# Patient Record
Sex: Male | Born: 1995 | Race: Black or African American | Hispanic: No | Marital: Single | Smoking: Former smoker
Health system: Southern US, Community
[De-identification: ages and names within clinical notes are randomized; demographics above are authoritative.]

---
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
## 2019-04-05 ENCOUNTER — Ambulatory Visit: Payer: BC Managed Care – PPO | Attending: Internal Medicine

## 2019-04-05 DIAGNOSIS — Z20822 Contact with and (suspected) exposure to covid-19: Secondary | ICD-10-CM | POA: Insufficient documentation

## 2019-04-06 LAB — NOVEL CORONAVIRUS, NAA: SARS-CoV-2, NAA: NOT DETECTED

## 2019-04-08 ENCOUNTER — Ambulatory Visit: Payer: Self-pay

## 2019-04-08 NOTE — Telephone Encounter (Signed)
Unable to LM

## 2019-06-20 ENCOUNTER — Other Ambulatory Visit (HOSPITAL_COMMUNITY): Payer: Self-pay | Admitting: Family Medicine

## 2019-06-20 ENCOUNTER — Other Ambulatory Visit: Payer: Self-pay | Admitting: Family Medicine

## 2019-06-20 DIAGNOSIS — R1032 Left lower quadrant pain: Secondary | ICD-10-CM

## 2019-06-20 DIAGNOSIS — N50812 Left testicular pain: Secondary | ICD-10-CM

## 2019-06-21 ENCOUNTER — Other Ambulatory Visit: Payer: Self-pay

## 2019-06-21 ENCOUNTER — Ambulatory Visit
Admission: RE | Admit: 2019-06-21 | Discharge: 2019-06-21 | Disposition: A | Discharge status: Home / Self Care | Payer: BC Managed Care – PPO | Source: Ambulatory Visit | Attending: Family Medicine | Admitting: Family Medicine

## 2019-06-21 DIAGNOSIS — R1032 Left lower quadrant pain: Secondary | ICD-10-CM | POA: Insufficient documentation

## 2019-06-21 DIAGNOSIS — N50812 Left testicular pain: Secondary | ICD-10-CM | POA: Diagnosis present

## 2020-11-20 IMAGING — US US SCROTUM W/ DOPPLER COMPLETE
1 series · 13 of 25 positions shown · non-contrast
Comparison: None available.

CLINICAL DATA: Initial evaluation for left groin pain, pain in left
testicle for 4 days.

EXAM:
SCROTAL ULTRASOUND
DOPPLER ULTRASOUND OF THE TESTICLES
TECHNIQUE: Complete ultrasound examination of the testicles, epididymis, and
other scrotal structures was performed. Color and spectral Doppler
ultrasound were also utilized to evaluate blood flow to the
testicles.

[Series 1: us scrotum w/ doppler complete · 0.08mm/px · 13 of 55 slices shown]
[im 1/55]
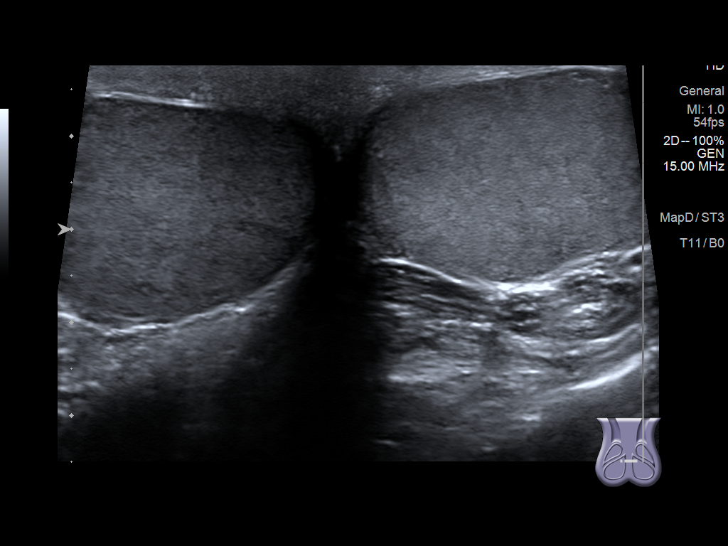
[im 5/55]
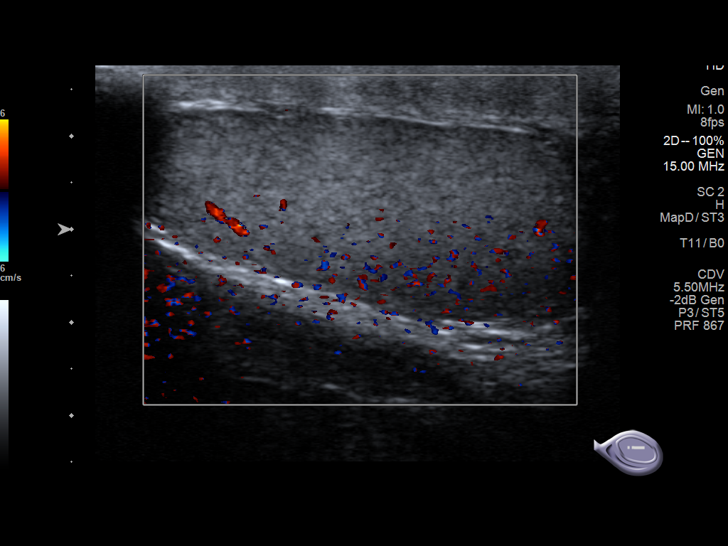
[im 10/55]
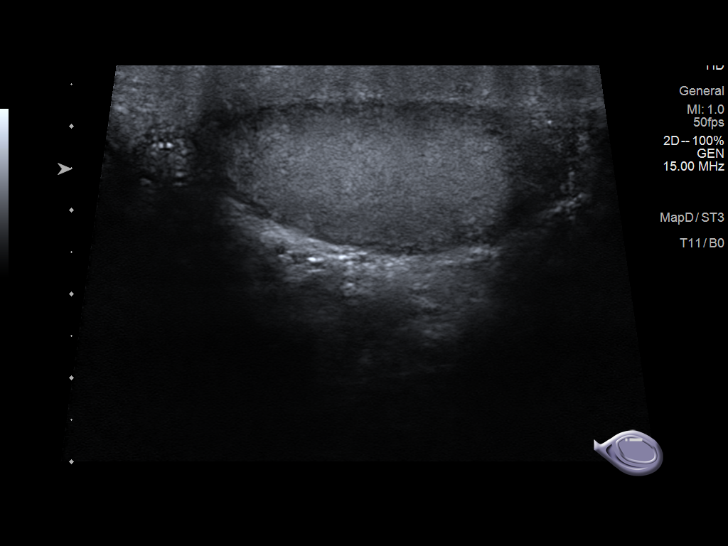
[im 14/55]
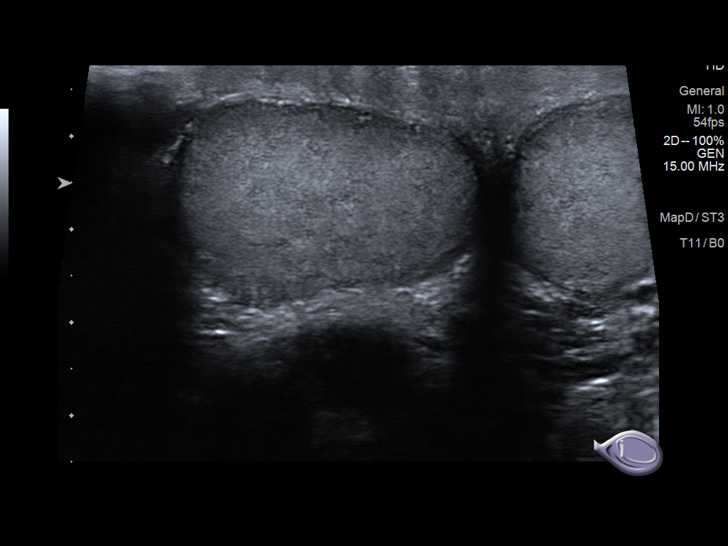
[im 19/55]
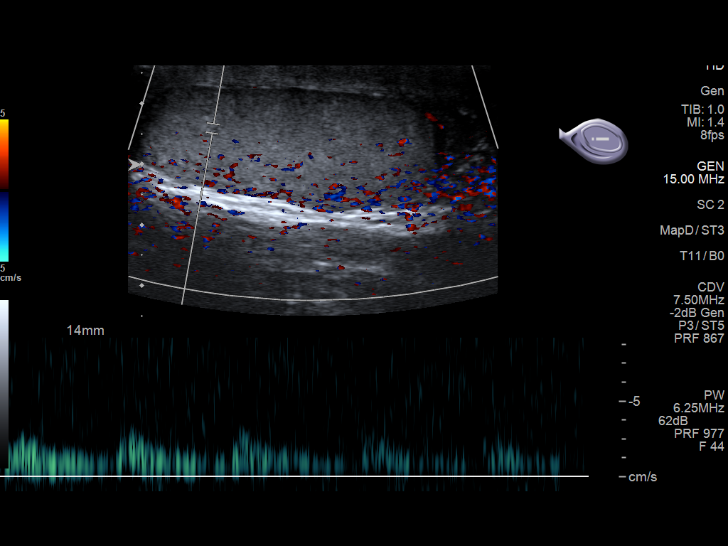
[im 23/55]
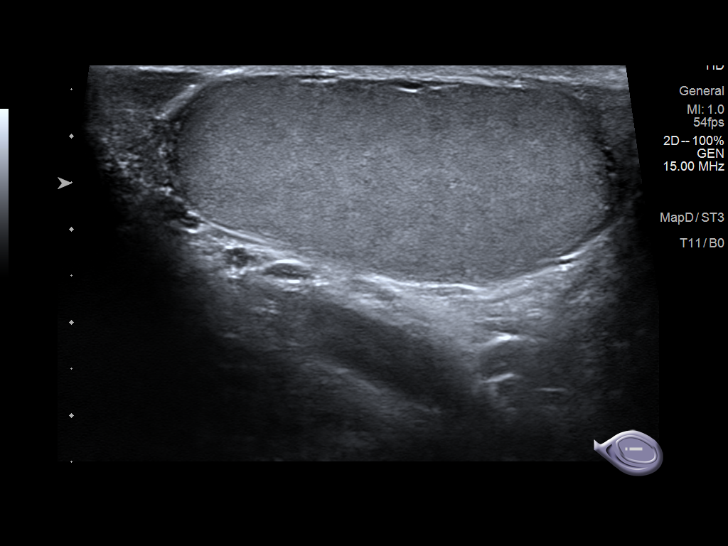
[im 28/55]
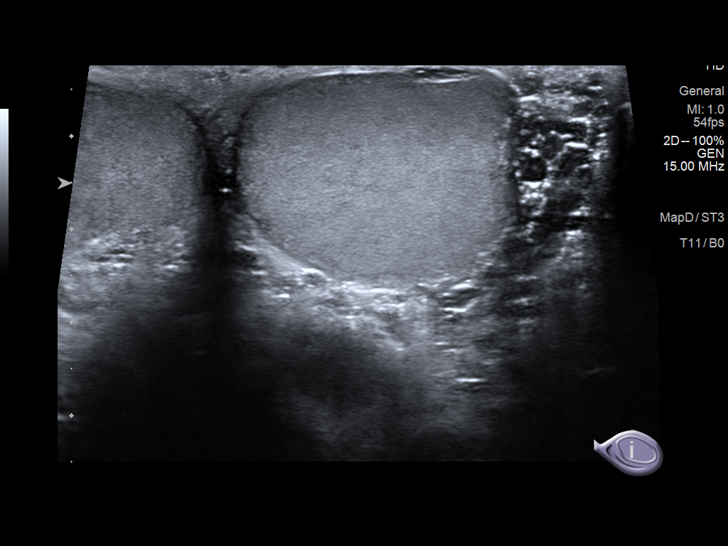
[im 32/55]
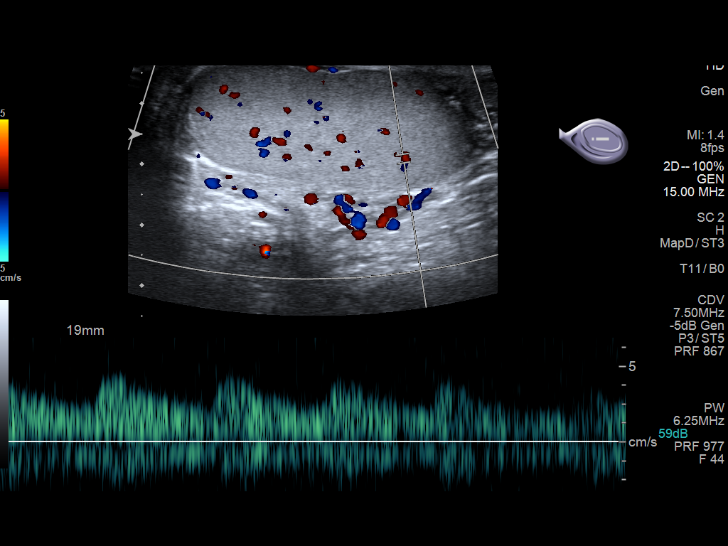
[im 37/55]
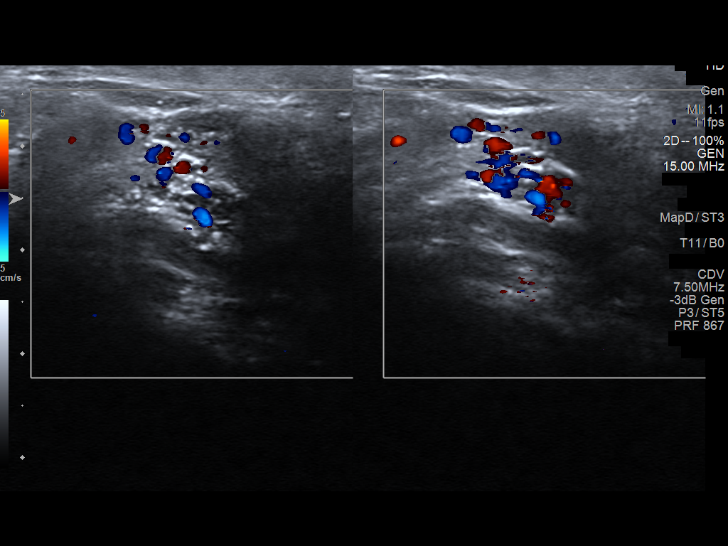
[im 41/55]
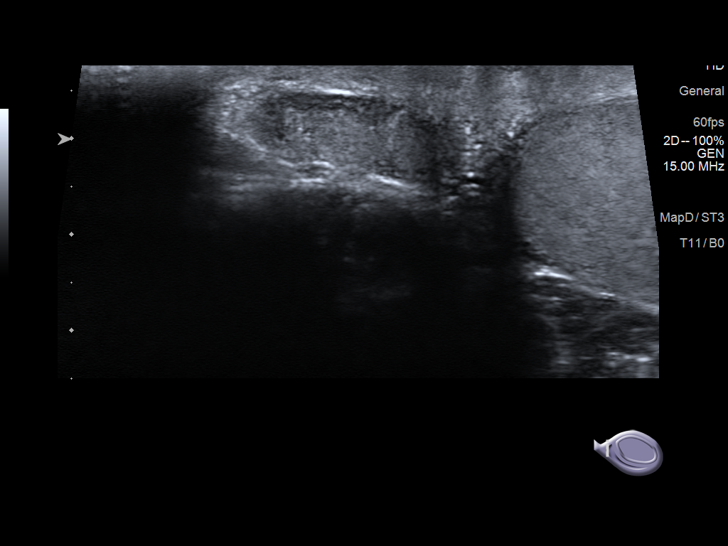
[im 46/55]
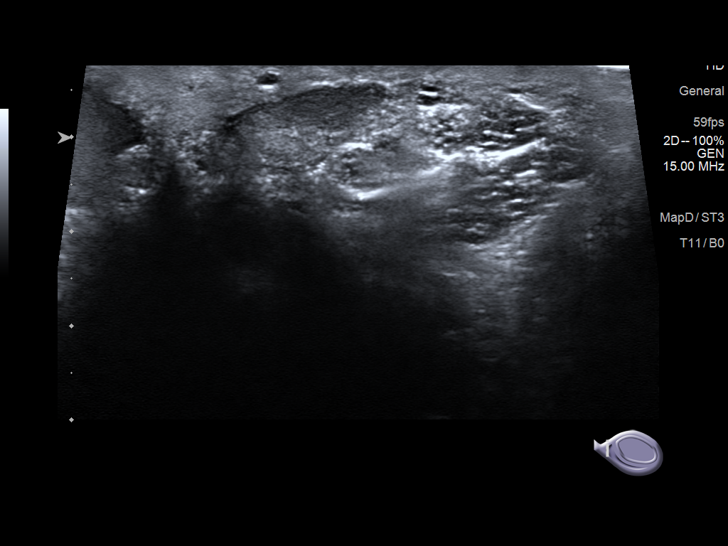
[im 50/55]
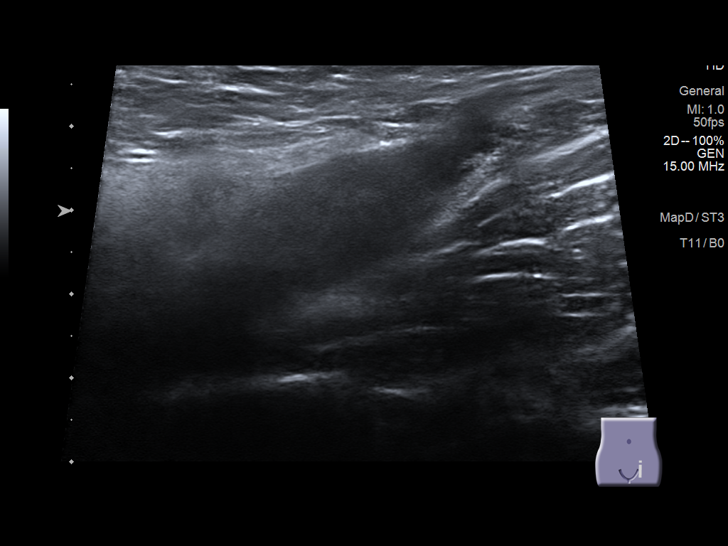
[im 55/55]
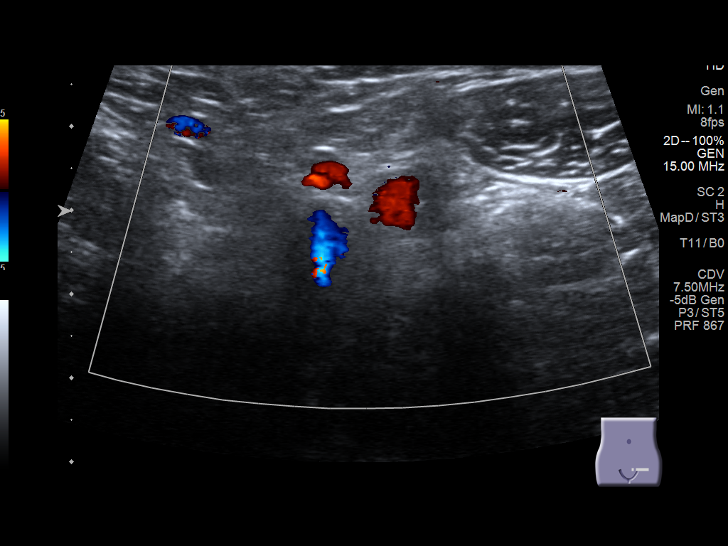

[13 of 25 positions shown; findings below may reference images not displayed]

FINDINGS: Right testicle

Measurements: 5.0 x 1.9 x 3.5 cm. No mass or microlithiasis
visualized.

Left testicle

Measurements: 4.9 x 2.0 x 2.9 cm. No mass or microlithiasis
visualized. There is increased vascularity within the left testicle
as compared to the right.

Right epididymis:  Normal in size and appearance.

Left epididymis:  Normal in size and appearance.

Hydrocele:  None visualized.

Varicocele:  None visualized.

Pulsed Doppler interrogation of both testes demonstrates normal low
resistance arterial and venous waveforms bilaterally.
IMPRESSION: 1. Asymmetric hypervascularity within the left testicle, suggesting
possible acute orchitis. Correlation with symptomatology and
physical exam recommended.
2. Otherwise normal scrotal ultrasound. No evidence for torsion or
other acute abnormality.

## 2022-06-24 ENCOUNTER — Ambulatory Visit
Admission: EM | Admit: 2022-06-24 | Discharge: 2022-06-24 | Disposition: A | Discharge status: Home / Self Care | Payer: BC Managed Care – PPO | Attending: Emergency Medicine | Admitting: Emergency Medicine

## 2022-06-24 DIAGNOSIS — A084 Viral intestinal infection, unspecified: Secondary | ICD-10-CM | POA: Diagnosis not present

## 2022-06-24 MED ORDER — ONDANSETRON 4 MG PO TBDP: 4.0000 mg | ORAL_TABLET | Freq: Three times a day (TID) | ORAL | 0 refills | Status: AC | PRN

## 2022-06-24 NOTE — Discharge Instructions (Addendum)
Take the antinausea medication as directed.    Keep yourself hydrated with clear liquids, such as water and Gatorade.  Follow the diarrhea diet as tolerated.   Go to the emergency department if you have worsening symptoms.    Follow up with your primary care provider.      

## 2022-06-24 NOTE — ED Triage Notes (Signed)
Nausea, vomiting, diarrhea, body aches, that started 2 days ago. Pt states he is able to keep water down.

## 2022-06-24 NOTE — ED Provider Notes (Signed)
Randy Boyer    CSN: [id number] Arrival date & time: 06/24/22  1745      History   Chief Complaint Chief Complaint  Patient presents with   Emesis   Diarrhea    HPI Randy Boyer is a 27 y.o. male.  Patient presents with 2 day history of nausea, vomiting, diarrhea.  Symptoms have improved today; no emesis or diarrhea today; just have nausea and abdominal discomfort.  Good oral intake of fluids.  Patient denies fever, chills, blood in stool, dysuria, or other symptoms.  No OTC medications taken.  No recent travel or antibiotic use.  No pertinent medical history.     The history is provided by the patient and medical records.    History reviewed. No pertinent past medical history.  There are no problems to display for this patient.   History reviewed. No pertinent surgical history.     Home Medications    Prior to Admission medications   Medication Sig Start Date End Date Taking? Authorizing Provider  ibuprofen (ADVIL,MOTRIN) 800 MG tablet Take 1 tablet (800 mg total) by mouth every 8 (eight) hours as needed. 06/26/15  Yes Ruby Cola, MD  ondansetron (ZOFRAN-ODT) 4 MG disintegrating tablet Take 1 tablet (4 mg total) by mouth every 8 (eight) hours as needed for nausea or vomiting. 06/24/22  Yes Sharion Balloon, NP  amoxicillin (AMOXIL) 500 MG capsule Take 1 capsule (500 mg total) by mouth 3 (three) times daily. 06/26/15   Ruby Cola, MD    Family History History reviewed. No pertinent family history.  Social History Social History   Tobacco Use   Smoking status: Never  Substance Use Topics   Alcohol use: No   Drug use: Never     Allergies   Patient has no known allergies.   Review of Systems Review of Systems  Constitutional:  Negative for chills and fever.  Gastrointestinal:  Positive for abdominal pain, diarrhea, nausea and vomiting. Negative for blood in stool and constipation.  Genitourinary:  Negative for dysuria and hematuria.  Skin:   Negative for rash.  All other systems reviewed and are negative.    Physical Exam Triage Vital Signs ED Triage Vitals  Enc Vitals Group     BP      Pulse      Resp      Temp      Temp src      SpO2      Weight      Height      Head Circumference      Peak Flow      Pain Score      Pain Loc      Pain Edu?      Excl. in [place]?    No data found.  Updated Vital Signs BP 125/83 (BP Location: Right Arm)   Pulse 90   Temp 98.3 F (36.8 C) (Oral)   Resp 17   SpO2 97%   Visual Acuity Right Eye Distance:   Left Eye Distance:   Bilateral Distance:    Right Eye Near:   Left Eye Near:    Bilateral Near:     Physical Exam Vitals and nursing note reviewed.  Constitutional:      General: He is not in acute distress.    Appearance: Normal appearance. He is well-developed. He is not ill-appearing.  HENT:     Mouth/Throat:     Mouth: Mucous membranes are moist.  Cardiovascular:  Rate and Rhythm: Normal rate and regular rhythm.     Heart sounds: Normal heart sounds.  Pulmonary:     Effort: Pulmonary effort is normal. No respiratory distress.     Breath sounds: Normal breath sounds.  Abdominal:     General: Bowel sounds are normal.     Palpations: Abdomen is soft.     Tenderness: There is no abdominal tenderness. There is no right CVA tenderness, left CVA tenderness, guarding or rebound.  Musculoskeletal:     Cervical back: Neck supple.  Skin:    General: Skin is warm and dry.  Neurological:     Mental Status: He is alert.  Psychiatric:        Mood and Affect: Mood normal.        Behavior: Behavior normal.      UC Treatments / Results  Labs (all labs ordered are listed, but only abnormal results are displayed) Labs Reviewed - No data to display  EKG   Radiology No results found.  Procedures Procedures (including critical care time)  Medications Ordered in UC Medications - No data to display  Initial Impression / Assessment and Plan / UC Course  I  have reviewed the triage vital signs and the nursing notes.  Pertinent labs & imaging results that were available during my care of the patient were reviewed by me and considered in my medical decision making (see chart for details).    Viral gastroenteritis.  Treating nausea and vomiting with Zofran.  Discussed clear liquid diet.  Instructed patient to advance to diarrhea diet as tolerated.  Discussed maintaining oral hydration at home; ED precautions discussed.  Education provided on viral gastroenteritis.  Instructed patient to follow-up with his PCP as needed.  He agrees to plan of care.   Final Clinical Impressions(s) / UC Diagnoses   Final diagnoses:  Viral gastroenteritis     Discharge Instructions      Take the antinausea medication as directed.    Keep yourself hydrated with clear liquids, such as water and Gatorade.  Follow the diarrhea diet as tolerated.   Go to the emergency department if you have worsening symptoms.    Follow up with your primary care provider.          ED Prescriptions     Medication Sig Dispense Auth. Provider   ondansetron (ZOFRAN-ODT) 4 MG disintegrating tablet Take 1 tablet (4 mg total) by mouth every 8 (eight) hours as needed for nausea or vomiting. 20 tablet Sharion Balloon, NP      PDMP not reviewed this encounter.   Sharion Balloon, NP 06/24/22 [phone]

## 2023-11-16 ENCOUNTER — Ambulatory Visit (HOSPITAL_COMMUNITY): Payer: Self-pay

## 2023-11-16 ENCOUNTER — Ambulatory Visit
Admission: EM | Admit: 2023-11-16 | Discharge: 2023-11-16 | Disposition: A | Discharge status: Home / Self Care | Attending: Physician Assistant | Admitting: Physician Assistant

## 2023-11-16 ENCOUNTER — Encounter: Payer: Self-pay | Admitting: Emergency Medicine

## 2023-11-16 DIAGNOSIS — R202 Paresthesia of skin: Secondary | ICD-10-CM | POA: Insufficient documentation

## 2023-11-16 DIAGNOSIS — K2900 Acute gastritis without bleeding: Secondary | ICD-10-CM | POA: Diagnosis present

## 2023-11-16 DIAGNOSIS — R1012 Left upper quadrant pain: Secondary | ICD-10-CM | POA: Diagnosis present

## 2023-11-16 LAB — COMPREHENSIVE METABOLIC PANEL WITH GFR
ALT: 14 U/L (ref 0–44)
AST: 21 U/L (ref 15–41)
Albumin: 4.4 g/dL (ref 3.5–5.0)
Alkaline Phosphatase: 63 U/L (ref 38–126)
Anion gap: 11 (ref 5–15)
BUN: 16 mg/dL (ref 6–20)
CO2: 24 mmol/L (ref 22–32)
Calcium: 9.1 mg/dL (ref 8.9–10.3)
Chloride: 101 mmol/L (ref 98–111)
Creatinine, Ser: 0.85 mg/dL (ref 0.61–1.24)
GFR, Estimated: >60 mL/min (ref >60)
Glucose, Bld: 115 mg/dL — ABNORMAL HIGH (ref 70–99)
Potassium: 4 mmol/L (ref 3.5–5.1)
Sodium: 136 mmol/L (ref 135–145)
Total Bilirubin: 1 mg/dL (ref 0.0–1.2)
Total Protein: 8 g/dL (ref 6.5–8.1)

## 2023-11-16 LAB — CBC WITH DIFFERENTIAL/PLATELET
Abs Immature Granulocytes: 0.01 K/uL (ref 0.00–0.07)
Basophils Absolute: 0 K/uL (ref 0.0–0.1)
Basophils Relative: 0 %
Eosinophils Absolute: 0.1 K/uL (ref 0.0–0.5)
Eosinophils Relative: 1 %
HCT: 39 % (ref 39.0–52.0)
Hemoglobin: 13.6 g/dL (ref 13.0–17.0)
Immature Granulocytes: 0 %
Lymphocytes Relative: 23 %
Lymphs Abs: 1.2 K/uL (ref 0.7–4.0)
MCH: 31.6 pg (ref 26.0–34.0)
MCHC: 34.9 g/dL (ref 30.0–36.0)
MCV: 90.7 fL (ref 80.0–100.0)
Monocytes Absolute: 0.4 K/uL (ref 0.1–1.0)
Monocytes Relative: 8 %
Neutro Abs: 3.4 K/uL (ref 1.7–7.7)
Neutrophils Relative %: 68 %
Platelets: 209 K/uL (ref 150–400)
RBC: 4.3 MIL/uL (ref 4.22–5.81)
RDW: 11.6 % (ref 11.5–15.5)
WBC: 5.1 K/uL (ref 4.0–10.5)
nRBC: 0 % (ref 0.0–0.2)

## 2023-11-16 LAB — VITAMIN B12: Vitamin B-12: 179 pg/mL — ABNORMAL LOW (ref 180–914)

## 2023-11-16 MED ORDER — FAMOTIDINE 20 MG PO TABS: ORAL_TABLET | ORAL | 0 refills | Status: AC

## 2023-11-16 NOTE — Discharge Instructions (Addendum)
-   I will contact you through MyChart with your results. - Your abdominal discomfort is likely related to gastritis and reflux.  We discussed spicy, fatty foods, greasy foods, large meals can make this worse. - I sent medication to the pharmacy to help reduce your stomach acid. - Try to adjust your diet accordingly and increase your fluid intake. - Unsure as the cause of the tingling.  Checking B12 level.  However, I would recommend you follow-up with her primary care provider to work this up further if it continues.  If you ever have any associated significant pain or weakness please go to the ER.

## 2023-11-16 NOTE — ED Provider Notes (Signed)
 [facility]    CSN: [id number] Arrival date & time: 11/16/23  0901      History   Chief Complaint Chief Complaint  Patient presents with   Abdominal Pain    HPI Randy Boyer is a 28 y.o. male presenting for several week history of left upper quadrant abdominal pain radiating throughout the rest of the stomach.  Occasional associated nausea.  No fever, vomiting, diarrhea, constipation, blood in stool.  Patient says symptoms are worse than ever he eats fried foods.  States that he did have [place] food last night and that seemed to make things worse.  Has never been diagnosed or treated for acid reflux before.  Has not taken any OTC meds.  Patient also mentions noting some tingling in his right foot and right hand since last night.  He says the bottom of his right foot hurt yesterday but is not hurting now.  Denies any other areas of tingling.  Not reporting any headache, dizziness, vision changes, chest pain, shortness of breath, numbness, weakness of extremities.    Patient does not have a PCP.  HPI  History reviewed. No pertinent past medical history.  There are no active problems to display for this patient.   History reviewed. No pertinent surgical history.     Home Medications    Prior to Admission medications   Medication Sig Start Date End Date Taking? Authorizing Provider  famotidine  (PEPCID ) 20 MG tablet Take 1-2 tabs po daily prn abdominal pain/reflux 11/16/23  Yes Arvis Huxley B, PA-C  amoxicillin  (AMOXIL ) 500 MG capsule Take 1 capsule (500 mg total) by mouth 3 (three) times daily. 06/26/15   Waddell Rock CHRISTELLA, MD  ibuprofen  (ADVIL ,MOTRIN ) 800 MG tablet Take 1 tablet (800 mg total) by mouth every 8 (eight) hours as needed. 06/26/15   Waddell Rock CHRISTELLA, MD  ondansetron  (ZOFRAN -ODT) 4 MG disintegrating tablet Take 1 tablet (4 mg total) by mouth every 8 (eight) hours as needed for nausea or vomiting. 06/24/22   Corlis Burnard DEL, NP    Family History No family  history on file.  Social History Social History   Tobacco Use   Smoking status: Former    Types: Cigarettes   Smokeless tobacco: Never  Vaping Use   Vaping status: Former  Substance Use Topics   Alcohol use: Yes   Drug use: Never     Allergies   Patient has no known allergies.   Review of Systems Review of Systems  Constitutional:  Negative for appetite change, fatigue, fever and unexpected weight change.  HENT:  Negative for congestion, rhinorrhea and sore throat.   Respiratory:  Negative for cough and shortness of breath.   Cardiovascular:  Negative for chest pain.  Gastrointestinal:  Positive for abdominal pain. Negative for diarrhea, nausea and vomiting.  Genitourinary:  Negative for difficulty urinating, dysuria, penile discharge and testicular pain.  Musculoskeletal:  Negative for myalgias.  Neurological:  Negative for dizziness, weakness, light-headedness and headaches.       Tingling right hand and right foot     Physical Exam Triage Vital Signs ED Triage Vitals  Encounter Vitals Group     BP      Girls Systolic BP Percentile      Girls Diastolic BP Percentile      Boys Systolic BP Percentile      Boys Diastolic BP Percentile      Pulse      Resp      Temp  Temp src      SpO2      Weight      Height      Head Circumference      Peak Flow      Pain Score      Pain Loc      Pain Education      Exclude from Growth Chart    No data found.  Updated Vital Signs BP 114/75 (BP Location: Right Arm)   Pulse 96   Temp 98.6 F (37 C) (Oral)   Resp 16   Wt 164 lb (74.4 kg)   SpO2 98%    Physical Exam Vitals and nursing note reviewed.  Constitutional:      General: He is not in acute distress.    Appearance: He is well-developed. He is not ill-appearing.  HENT:     Head: Normocephalic and atraumatic.     Nose: Nose normal.     Mouth/Throat:     Mouth: Mucous membranes are moist.     Pharynx: Oropharynx is clear.  Eyes:     General: No  scleral icterus.    Conjunctiva/sclera: Conjunctivae normal.  Cardiovascular:     Rate and Rhythm: Normal rate and regular rhythm.  Pulmonary:     Effort: Pulmonary effort is normal. No respiratory distress.     Breath sounds: Normal breath sounds.  Abdominal:     Palpations: Abdomen is soft.     Tenderness: There is abdominal tenderness in the left upper quadrant.  Musculoskeletal:     Cervical back: Neck supple.  Skin:    General: Skin is warm and dry.     Capillary Refill: Capillary refill takes less than 2 seconds.  Neurological:     General: No focal deficit present.     Mental Status: He is alert. Mental status is at baseline.     Motor: No weakness.     Gait: Gait normal.  Psychiatric:        Mood and Affect: Mood normal.        Behavior: Behavior normal.      UC Treatments / Results  Labs (all labs ordered are listed, but only abnormal results are displayed) Labs Reviewed  CBC WITH DIFFERENTIAL/PLATELET  COMPREHENSIVE METABOLIC PANEL WITH GFR  VITAMIN B12    EKG   Radiology No results found.  Procedures Procedures (including critical care time)  Medications Ordered in UC Medications - No data to display  Initial Impression / Assessment and Plan / UC Course  I have reviewed the triage vital signs and the nursing notes.  Pertinent labs & imaging results that were available during my care of the patient were reviewed by me and considered in my medical decision making (see chart for details).   28 y/o male presents for left upper quadrant intermittent mild pain for the past couple of weeks. He says symptoms are worse with large meals, fried foods and spicy foods. Also reporting tingling of right hand and foot since yesterday. No injury, pain or weakness.  Vitals stable and normal. Overall well appearing. NAD. On exam, has had  TTP LUQ mildly. Chest clear. Heart RRR.   CBC, CMP and vitamin B12 obtained. Will contact patient through MyChart with results.    Sent prescription for famotidine  for gastritis. Discussed dietary and lifestyle changes.   No significant finding on labs. Pending B12.   Patient was advised to follow up with a PCP.    Final Clinical Impressions(s) / UC Diagnoses  Final diagnoses:  Acute gastritis without hemorrhage, unspecified gastritis type  LUQ pain  Paresthesias     Discharge Instructions      - I will contact you through MyChart with your results. - Your abdominal discomfort is likely related to gastritis and reflux.  We discussed spicy, fatty foods, greasy foods, large meals can make this worse. - I sent medication to the pharmacy to help reduce your stomach acid. - Try to adjust your diet accordingly and increase your fluid intake. - Unsure as the cause of the tingling.  Checking B12 level.  However, I would recommend you follow-up with her primary care provider to work this up further if it continues.  If you ever have any associated significant pain or weakness please go to the ER.     ED Prescriptions     Medication Sig Dispense Auth. Provider   famotidine  (PEPCID ) 20 MG tablet Take 1-2 tabs po daily prn abdominal pain/reflux 60 tablet Arvis Jolan NOVAK, PA-C      PDMP not reviewed this encounter.   Arvis Jolan NOVAK, PA-C 11/16/23 1125

## 2023-11-16 NOTE — ED Triage Notes (Signed)
 Pt presents with epigastric abdominal pain for a few weeks. Pt describes the pain as achy. Pt denies any other GI symptoms. He developed some tingling in his right foot and right hand last night. Pt has not taken anything for his symptoms.
